# Patient Record
Sex: Female | Born: 1955 | Race: Black or African American | Hispanic: No | Marital: Married | Smoking: Never smoker
Health system: Southern US, Community
[De-identification: ages and names within clinical notes are randomized; demographics above are authoritative.]

## PROBLEM LIST (undated history)

## (undated) DIAGNOSIS — R51 Headache: Secondary | ICD-10-CM

## (undated) DIAGNOSIS — R519 Headache, unspecified: Secondary | ICD-10-CM

## (undated) DIAGNOSIS — E119 Type 2 diabetes mellitus without complications: Secondary | ICD-10-CM

## (undated) DIAGNOSIS — G4733 Obstructive sleep apnea (adult) (pediatric): Secondary | ICD-10-CM

## (undated) DIAGNOSIS — I10 Essential (primary) hypertension: Secondary | ICD-10-CM

## (undated) DIAGNOSIS — E782 Mixed hyperlipidemia: Secondary | ICD-10-CM

## (undated) DIAGNOSIS — Z9989 Dependence on other enabling machines and devices: Secondary | ICD-10-CM

## (undated) HISTORY — DX: Obstructive sleep apnea (adult) (pediatric): G47.33

## (undated) HISTORY — DX: Headache, unspecified: R51.9

## (undated) HISTORY — PX: ANKLE FRACTURE SURGERY: SHX122

## (undated) HISTORY — DX: Mixed hyperlipidemia: E78.2

## (undated) HISTORY — DX: Dependence on other enabling machines and devices: Z99.89

## (undated) HISTORY — PX: OTHER SURGICAL HISTORY: SHX169

## (undated) HISTORY — PX: FOOT TENDON SURGERY: SHX958

## (undated) HISTORY — DX: Type 2 diabetes mellitus without complications: E11.9

## (undated) HISTORY — PX: ABDOMINAL HYSTERECTOMY: SHX81

## (undated) HISTORY — DX: Headache: R51

## (undated) HISTORY — PX: BREAST BIOPSY: SHX20

---

## 1998-11-11 ENCOUNTER — Encounter: Payer: Self-pay | Admitting: Emergency Medicine

## 1998-11-11 ENCOUNTER — Emergency Department (HOSPITAL_COMMUNITY): Admission: EM | Admit: 1998-11-11 | Discharge: 1998-11-11 | Payer: Self-pay | Admitting: Emergency Medicine

## 2000-05-15 ENCOUNTER — Other Ambulatory Visit: Admission: RE | Admit: 2000-05-15 | Discharge: 2000-05-15 | Payer: Self-pay | Admitting: Obstetrics and Gynecology

## 2000-07-18 ENCOUNTER — Inpatient Hospital Stay (HOSPITAL_COMMUNITY): Admission: EM | Admit: 2000-07-18 | Discharge: 2000-07-19 | Payer: Self-pay

## 2000-07-18 ENCOUNTER — Encounter: Payer: Self-pay | Admitting: Cardiology

## 2001-08-05 ENCOUNTER — Other Ambulatory Visit: Admission: RE | Admit: 2001-08-05 | Discharge: 2001-08-05 | Payer: Self-pay | Admitting: Obstetrics and Gynecology

## 2002-09-16 ENCOUNTER — Other Ambulatory Visit: Admission: RE | Admit: 2002-09-16 | Discharge: 2002-09-16 | Payer: Self-pay | Admitting: Obstetrics and Gynecology

## 2004-08-04 ENCOUNTER — Encounter: Admission: RE | Admit: 2004-08-04 | Discharge: 2004-08-04 | Payer: Self-pay | Admitting: Obstetrics and Gynecology

## 2005-07-11 ENCOUNTER — Encounter: Admission: RE | Admit: 2005-07-11 | Discharge: 2005-07-11 | Payer: Self-pay | Admitting: Rheumatology

## 2005-08-15 ENCOUNTER — Encounter: Admission: RE | Admit: 2005-08-15 | Discharge: 2005-08-15 | Payer: Self-pay | Admitting: Obstetrics and Gynecology

## 2006-07-16 ENCOUNTER — Inpatient Hospital Stay (HOSPITAL_COMMUNITY): Admission: EM | Admit: 2006-07-16 | Discharge: 2006-07-18 | Payer: Self-pay | Admitting: Emergency Medicine

## 2006-07-17 ENCOUNTER — Encounter (INDEPENDENT_AMBULATORY_CARE_PROVIDER_SITE_OTHER): Payer: Self-pay | Admitting: *Deleted

## 2006-10-24 ENCOUNTER — Encounter: Admission: RE | Admit: 2006-10-24 | Discharge: 2006-10-24 | Payer: Self-pay | Admitting: Family Medicine

## 2007-12-09 ENCOUNTER — Encounter: Admission: RE | Admit: 2007-12-09 | Discharge: 2007-12-09 | Payer: Self-pay | Admitting: Family Medicine

## 2009-03-15 ENCOUNTER — Encounter: Admission: RE | Admit: 2009-03-15 | Discharge: 2009-03-15 | Payer: Self-pay | Admitting: Family Medicine

## 2009-06-09 ENCOUNTER — Inpatient Hospital Stay (HOSPITAL_COMMUNITY): Admission: EM | Admit: 2009-06-09 | Discharge: 2009-06-11 | Payer: Self-pay | Admitting: Interventional Cardiology

## 2009-06-09 ENCOUNTER — Ambulatory Visit: Payer: Self-pay | Admitting: Internal Medicine

## 2009-06-09 ENCOUNTER — Ambulatory Visit: Payer: Self-pay | Admitting: Diagnostic Radiology

## 2009-07-13 ENCOUNTER — Encounter: Admission: RE | Admit: 2009-07-13 | Discharge: 2009-07-13 | Payer: Self-pay | Admitting: Family Medicine

## 2010-02-15 ENCOUNTER — Encounter: Admission: RE | Admit: 2010-02-15 | Discharge: 2010-02-15 | Payer: Self-pay | Admitting: Obstetrics and Gynecology

## 2010-05-28 ENCOUNTER — Encounter: Payer: Self-pay | Admitting: Rheumatology

## 2010-05-28 ENCOUNTER — Encounter: Payer: Self-pay | Admitting: Family Medicine

## 2010-07-03 ENCOUNTER — Other Ambulatory Visit: Payer: Self-pay | Admitting: Family Medicine

## 2010-07-03 DIAGNOSIS — R51 Headache: Secondary | ICD-10-CM

## 2010-07-04 ENCOUNTER — Ambulatory Visit
Admission: RE | Admit: 2010-07-04 | Discharge: 2010-07-04 | Disposition: A | Discharge status: Home / Self Care | Payer: Managed Care, Other (non HMO) | Source: Ambulatory Visit | Attending: Family Medicine | Admitting: Family Medicine

## 2010-07-04 DIAGNOSIS — R51 Headache: Secondary | ICD-10-CM

## 2010-07-05 ENCOUNTER — Other Ambulatory Visit (HOSPITAL_BASED_OUTPATIENT_CLINIC_OR_DEPARTMENT_OTHER): Payer: Self-pay | Admitting: Family Medicine

## 2010-07-05 ENCOUNTER — Ambulatory Visit (HOSPITAL_BASED_OUTPATIENT_CLINIC_OR_DEPARTMENT_OTHER)
Admission: RE | Admit: 2010-07-05 | Discharge: 2010-07-05 | Disposition: A | Discharge status: Home / Self Care | Payer: Managed Care, Other (non HMO) | Source: Ambulatory Visit | Attending: Family Medicine | Admitting: Family Medicine

## 2010-07-05 DIAGNOSIS — R51 Headache: Secondary | ICD-10-CM

## 2010-07-05 DIAGNOSIS — M949 Disorder of cartilage, unspecified: Secondary | ICD-10-CM | POA: Insufficient documentation

## 2010-07-05 DIAGNOSIS — M899 Disorder of bone, unspecified: Secondary | ICD-10-CM | POA: Insufficient documentation

## 2010-07-26 LAB — COMPREHENSIVE METABOLIC PANEL
ALT: 15 U/L (ref 0–35)
AST: 16 U/L (ref 0–37)
CO2: 26 mEq/L (ref 19–32)
Calcium: 9 mg/dL (ref 8.4–10.5)
Creatinine, Ser: 0.92 mg/dL (ref 0.4–1.2)
GFR calc Af Amer: >60 mL/min (ref >60)
GFR calc non Af Amer: >60 mL/min (ref >60)
Sodium: 141 mEq/L (ref 135–145)
Total Protein: 6.6 g/dL (ref 6.0–8.3)

## 2010-07-26 LAB — CBC
HCT: 34.2 % — ABNORMAL LOW (ref 36.0–46.0)
MCHC: 33.4 g/dL (ref 30.0–36.0)
MCHC: 33.6 g/dL (ref 30.0–36.0)
MCV: 82.2 fL (ref 78.0–100.0)
MCV: 83.6 fL (ref 78.0–100.0)
Platelets: 284 10*3/uL (ref 150–400)
RBC: 4.17 MIL/uL (ref 3.87–5.11)
RDW: 16.2 % — ABNORMAL HIGH (ref 11.5–15.5)
WBC: 7.6 10*3/uL (ref 4.0–10.5)

## 2010-07-26 LAB — CARDIAC PANEL(CRET KIN+CKTOT+MB+TROPI)
Relative Index: INVALID (ref 0.0–2.5)
Relative Index: INVALID (ref 0.0–2.5)
Total CK: 51 U/L (ref 7–177)
Total CK: 53 U/L (ref 7–177)
Troponin I: 0.02 ng/mL (ref 0.00–0.06)

## 2010-07-26 LAB — GLUCOSE, CAPILLARY: Glucose-Capillary: 104 mg/dL — ABNORMAL HIGH (ref 70–99)

## 2010-07-26 LAB — HEMOGLOBIN A1C: Hgb A1c MFr Bld: 6.2 % — ABNORMAL HIGH (ref 4.6–6.1)

## 2010-07-26 LAB — MRSA PCR SCREENING: MRSA by PCR: NEGATIVE

## 2010-07-26 LAB — MAGNESIUM: Magnesium: 2 mg/dL (ref 1.5–2.5)

## 2010-07-26 LAB — HEPARIN LEVEL (UNFRACTIONATED): Heparin Unfractionated: 0.41 IU/mL (ref 0.30–0.70)

## 2010-07-27 LAB — CBC
MCHC: 33.4 g/dL (ref 30.0–36.0)
MCV: 81 fL (ref 78.0–100.0)
Platelets: 361 10*3/uL (ref 150–400)
RDW: 15 % (ref 11.5–15.5)

## 2010-07-27 LAB — BASIC METABOLIC PANEL
BUN: 17 mg/dL (ref 6–23)
CO2: 31 mEq/L (ref 19–32)
Calcium: 10.4 mg/dL (ref 8.4–10.5)
Chloride: 101 mEq/L (ref 96–112)
Creatinine, Ser: 0.9 mg/dL (ref 0.4–1.2)

## 2010-07-27 LAB — POCT CARDIAC MARKERS: Troponin i, poc: <0.05 ng/mL (ref 0.00–0.09)

## 2010-07-27 LAB — URINALYSIS, ROUTINE W REFLEX MICROSCOPIC
Bilirubin Urine: NEGATIVE
Glucose, UA: NEGATIVE mg/dL
Hgb urine dipstick: NEGATIVE
Ketones, ur: NEGATIVE mg/dL
Protein, ur: NEGATIVE mg/dL

## 2010-07-27 LAB — PROTIME-INR
INR: 0.96 (ref 0.00–1.49)
Prothrombin Time: 12.7 seconds (ref 11.6–15.2)

## 2010-09-22 NOTE — H&P (Signed)
[name] NO.:  [id number]   MEDICAL RECORD NO.:  [id number]          PATIENT TYPE:  INP   LOCATION:  6527                         FACILITY:  [facility]   PHYSICIAN:  Elmore Guise., M.D.DATE OF BIRTH:  10-09-1955   DATE OF ADMISSION:  07/16/2006  DATE OF DISCHARGE:                              HISTORY & PHYSICAL   PRIMARY CARE PHYSICIAN:  Dr. [facility].   HISTORY OF PRESENT ILLNESS:  The patient is a very pleasant 55 year old  African American female with past medical history of gastroesophageal  reflux disease, hypertension and family history of heart disease who  presents for evaluation of chest pain and shortness of breath.  The  patient reports normal state of health, however, notes increased stress  at work and at home.  She does state her mother died from heart attack  approximately 4 months ago and since that time, she has been having off-  and-on chest pain.  She describes her initial symptoms as occasional  left-sided chest pain over the last couple weeks that feels like a  twinge.  She will take an aspirin with significant improvement.  Today  she had left-sided chest pressure associated with mild dyspnea and  diaphoresis.  She was given a nitroglycerin with improvement.  She  denies any orthopnea, no PND.  She does report epigastric discomfort as  well as hot flashes.  She has been able to do her normal exercise  without any significant problems.  Her symptoms did worsen while walking  to the car this afternoon.   CURRENT MEDICATIONS:  Ziac and Norvasc.   ALLERGIES:  STADOL causing shortness of breath.   FAMILY HISTORY:  Positive for heart disease.   SOCIAL HISTORY:  She is married.  She works at a local back in [employer].  No tobacco.  Does have occasional alcohol use.   PHYSICAL EXAMINATION:  VITAL SIGNS:  She is afebrile.  Blood pressure  130-160/60-80, heart rate is 70-80.  She is sating 97% on  room air.  GENERAL APPEARANCE:  She is a very pleasant middle-aged [place] American  female, alert and oriented x4.  No acute distress.  NECK:  She has no JVD and no bruits.  LUNGS:  Clear.  HEART:  Regular with no significant murmurs, rubs, or gallops.  ABDOMEN:  Mild epigastric tenderness but no rebound or guarding.  She  has normal bowel sounds and no bruits noted.  EXTREMITIES:  Warm with 2+ pulses and no significant edema.   LABORATORY DATA:  A white count of 10.6, hemoglobin 12.3, platelet count  of 333.  Her coags are normal.  BUN and creatinine are 19 and 1.1,  potassium 3.6, myoglobin is 78-91.  Troponin I is less than 0.05 x2 and  myoglobin is less than 1 and 1.3.   Chest x-ray showed no acute cardiopulmonary disease.   ECG shows normal sinus rhythm, right atrial enlargement and LVH with  nonspecific ST-T wave changes noted in the inferolateral lead.  Old  chart is still pending at time of dictation.  No comparisons are  available currently.   IMPRESSION:  1. Chest pain.  2. Hypertension.  3. Increased stress.  4. History of gastroesophageal reflux disease.   PLAN:  The patient will be admitted for observation.  We will continue  serial cardiac enzymes.  She will be given Lovenox 1 mg/kg subcu x1 dose  tonight.  We will continue Ziac and Norvasc.  Will add Ranexa and give a  GI cocktail.  Should her symptoms resolve and she is up and ambulatory  with negative cardiac markers, outpatient workup would be indicated.  However, if her cardiac markers are positive or if she has further  symptoms, then inpatient workup would be indicated.  I discussed this at  length with her and her husband.  They did report that her last cardiac  catheterization was done back in 2000, from the computer shows that she  was cathed in 2002, showing normal coronaries and normal LV systolic  function.  All her questions were answered.      Elmore Guise., M.D.  Electronically  Signed     TWK/MEDQ  D:  07/16/2006  T:  07/18/2006  Job:  [id number]

## 2010-09-22 NOTE — Discharge Summary (Signed)
[place]. [facility]  Patient:    [name], [name]                       MRN: [id number] Adm. Date:  07/18/00 Disc. Date: 07/19/00 Attending:  Francisca December, M.D. CC:         Marinda Elk, M.D.   Discharge Summary  REASON FOR ADMISSION:  Chest pain.  HISTORY OF PRESENT ILLNESS:  The patient is a 55 year old woman with a long history of GERD who developed spontaneous onset of left arm aching pain around 1430 today.  She took a BC powder, felt a little better for awhile, then worsened.  Had some mild diaphoresis and and episode of lightheadedness.  She reported to her physicians office and while awaiting to be seen, she had the onset of substernal dull aching pain.  This was associated with dyspnea, no diaphoresis but there was mild nausea.  An ECG obtained there was thought to be abnormal and EMS was called.  She was transported to [facility].  En route, she received sublingual nitroglycerin x 3.  Upon my evaluation in the emergency room, she appeared reasonably comfortable, complaining of heavy substernal chest pain, which was "worse".  She had slight associated arm pain.  There was a pleuritic component to her pain.  The patient has a long history of GERD symptoms and was treated for 3-4 months with Prevacid up to 3-4 months ago.  She has frequent problems with reflux at nights, characterized by epigastric discomfort, burning sensation below the sternum and pyrosis.  It is frequently associated with a sensation of closing airway and she has to have her husband perform a Heimlich maneuver (!?).  No food is usually ejected, but she is able to breathe better.  Again, she has not had any pro-time pump inhibitor for the past 3-4 months.  PAST MEDICAL HISTORY: 1. GERD as above. 2. TAH and LSO, approximately five years ago, recently started on Premarin. Otherwise, her past medical history is unremarkable.  There is no history of diabetes, hypertension  or hyperlipidemia.  CURRENT MEDICATIONS:  Premarin 0.625 p.o. q.d.  ALLERGIES:  STADOL; caused difficulty breathing.  FAMILY HISTORY:  Mother has sarcoid cardiomyopathy and a pacemaker.  There is no history of early coronary disease except a grandmother who died suddenly at the age of 58.  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  She is accompanied in the emergency room by her husband this evening.  She has three children, ages 24 to 41.  She is a nonsmoker and nondrinker.  She does not use any recreational drugs.  REVIEW OF SYSTEMS:  She wears contacts, has good hearing, teeth and gums are in good repair, no frequent headache.  She has dysphagia frequently to solid foods, not to liquid.  It is usually up in the throat.  She denies any problems with cough, wheezing or hemoptysis, no chronic abdominal pain, no orthopnea, PND, except associated with GERD.  No lower extremity edema.  She has not had any hematemesis or hematochezia, no melena, no hematuria, nocturia, dysuria, no difficulty starting or stopping her stream.  She has soreness in both knees, no muscle weakness or muscle pain.  She has never had a stroke, no paresthesias, dysesthesias, lightheadedness, vertigo and she has never been treated for an emotional disorder.  PHYSICAL EXAMINATION:  VITAL SIGNS:  Blood pressure is 160/90, pulse is 100 and regular, respiratory rate 22, temperature 99.3.  O2 saturation 98% on room air.  GENERAL:  She is an anxious, but well-appearing 55 year old woman in no acute distress.  HEENT:  Uremarkable.  Head is normocephalic and atraumatic.  Pupils are equal, round and reactive to light and accommodation.  Sclera are anicteric.  Oral mucosa is pink and moist.  Teeth and gums are in good repair.  Neck is supple without thyromegaly or masses.  ______ normal, no bruit, no jugular venous distension.  CHEST:  Her chest is clear with good excursion  bilaterally, normal vesicular, breath sounds are heard throughout the precordium, is quiet.  Chest wall is palpated and is tender along the left costochondral margin, but not on the right, reproduces a rather sharp aching pain.  CARDIOVASCULAR:  Normal S1, S2, no S3, S3, murmur, click or rub noted.  PMI is not palpable.  ABDOMEN:  The abdomen is mildly obese, soft, nontender, no hepatosplenomegaly, no masses, no midline pulsatile mass, bowel sounds present in all quadrants.  GENITALIA:  External genitalia appears normal.  Rectal not performed.  EXTREMITIES:  Extremities show full range of motion, no edema ______ .  NEUROLOGICAL:  Cranial nerves II-XII intact.  Motor and sensory is grossly intact.  Gait not tested.  SKIN:  Warm, dry and clear.  ELECTROCARDIOGRAM:  Tracing performed at [facility] showed diffuse mild ST segment depression, especially in the inferior and lateral V leads.  EKG performed here again continued to have chest heaviness, appears more normal with general resolution of the ST abnormality.  IMPRESSION:  Atypical angina, spontaneous, possible UAP (doubt), more likely gastroesophageal reflux disease with esophageal spasm.  She also has a component of chest wall pain.  PLAN:  She will be admitted to rule out a myocardial infarction, serial CK-MB and troponin enzymes will be obtained.  Repeat ECG.  Will treat initially with IV nitroglycerin and IV heparin, pharmacy to dose, provide a GI cocktail and IV Protonix as well as oral beta blocker.  If a myocardial infarction is ruled out, then exercise cardiolite will be undertaken tomorrow. DD:  07/18/00 TD:  07/18/00 Job: [id number]

## 2010-09-22 NOTE — Discharge Summary (Signed)
NAMECHAKITA, [name]              ACCOUNT NO.:  [id number]   MEDICAL RECORD NO.:  [id number]          PATIENT TYPE:  INP   LOCATION:  6527                         FACILITY:  [facility]   PHYSICIAN:  Elmore Guise., M.D.DATE OF BIRTH:  [birth year]   DATE OF ADMISSION:  07/16/2006  DATE OF DISCHARGE:  07/18/2006                               DISCHARGE SUMMARY   DISCHARGE DIAGNOSIS:  1. Chest pain.  2. History of hypertension.  3. Hypertriglyceridemia.  4. Strong family history of heart disease.   HISTORY OF PRESENT ILLNESS:  Mr. [name] is a very pleasant 55 year old  African-American female who was admitted with exertional chest  pain/pressure and shortness of breath.   HOSPITAL COURSE:  The patient's hospital course was uncomplicated.  She  was admitted to telemetry monitoring.  She had serial cardiac enzymes  performed which were negative for ischemia.  She did have serial ECGs  which showed nonspecific inferolateral ST-T wave changes, more prominent  from her prior ECGs.  She had an echocardiogram performed which showed  normal LV size and function with an EF of 55-60% and no wall motion  abnormalities.  She was placed on Ranexa, and she was up and ambulatory  with continued exertional chest heaviness.  She then was referred for a  cardiac catheterization.  Her cardiac catheterization was performed on  March 13 and showed normal-appearing coronary arteries with normal LV  systolic function.  She tolerated her procedure well and has been up and  active without any significant problems.   MEDICATIONS:  She will be discharged home to continue the following  medications:  1. Hormone replacement therapy.  2. Menest 0.625 mg daily.  3. Ziac once daily.  4. Ranexa 500 mg daily (Samples given).  5. Aspirin 81 mg daily.  6. Pepcid complete one to two daily.  7. Fish oil tablets 1000 mg 2 tablets twice a day.   She is to limit her activities for the next 48 hours with no heavy  lifting and no strenuous activities during that time period.  She is not  to go back to her normal work activities until Monday March17.  She  is to call the office with any problems or concerns.  Otherwise, we plan  to see her back in 2 weeks.  Her only lab work abnormality showed an  elevated triglycerides in the 250 range.  All of her other lipids were  in oral within normal parameters.  Please see complete summary for full  details.     Elmore Guise., M.D.  Electronically Signed    TWK/MEDQ  D:  07/19/2006  T:  07/20/2006  Job:  [id number]

## 2010-09-22 NOTE — Cardiovascular Report (Signed)
[place]. [facility]  Patient:    [name], [name]                       MRN: [id number] Proc. Date: 07/19/00 Attending:  Francisca December, M.D. CC:         Gladstone Pih, M.D.  Cardiac Catheterization Laboratory   Cardiac Catheterization  PROCEDURES PERFORMED: 1. Left heart catheterization. 2. Coronary angiography. 3. Left ventriculogram.  INDICATIONS:  [name] is a 55 year old woman, who was admitted yesterday evening after a prolonged episode of chest pain which is anginal in nature.  She has had chest pain on and off throughout the evening, responds to both IV and sublingual nitroglycerin as well as Demerol.  Electrocardiogram has been nonspecific.  Total CK-MB and troponin have been normal.  Because of persistent chest pain, she is brought to the catheterization laboratory at this time for identification of possible coronary artery disease and provide for further therapeutic options.  DESCRIPTION OF PROCEDURE:  Left heart catheterization was performed following the percutaneous insertion of a 6 French catheter sheath utilizing an anterior approach over a guiding J wire into the right femoral artery.  A 110 cm pigtail catheter was used to measure pressures in the ascending aorta and left ventricle, both prior to and following the ventriculogram.  A 30 degree RAO cine left ventriculogram was performed utilizing a power injector.  A 6 French #4 left and right Judkins catheters were used to perform coronary angiography. Cine angiography of each coronary artery was conducted in multiple LAO and RAO projections.  At the completion of the procedure, the catheter and catheter sheaths were removed.  Hemostasis was achieved by direct pressure.  The patient was transported to the recovery area in stable condition with intact distal pulse.  HEMODYNAMICS:  Systemic arterial pressure was 139/83 with a mean of 109 mmHg. There was no systolic gradient across  the aortic valve.  The left ventricular end-diastolic pressure was 18 mmHg preventriculogram and 20 mmHg post ventriculogram.  ANGIOGRAPHY:  LEFT VENTRICULOGRAM:  The left ventriculogram demonstrated normal global systolic function and normal regional wall motion.  The calculated ejection fraction utilizing the single plane cine method was 58%.  There was no mitral regurgitation and there was no coronary calcification seen.  There was a right dominant coronary system present.  The main left coronary artery normal.  The left anterior descending artery and its branches were normal.  The left circumflex artery and its branches were normal.  The right coronary artery and its branches were normal.  Collateral vessels were not seen.  FINAL IMPRESSION: 1. Intact left ventricular size and global systolic function. 2. Normal coronary arteries. 3. Noncardiac chest pain.  PLAN/RECOMENDATIONS:  The patient is presented with this gratifying news.  She will be treated with proton pump inhibitor, Nexium 40 mg p.o. q.d. and told to expect to not have any significant improvement for 7-10 days.  She should probably stay on this long-term given the degree of her GERD symptoms. DD:  07/19/00 TD:  07/19/00 Job: [id number]

## 2010-09-22 NOTE — Cardiovascular Report (Signed)
[name], [name]              ACCOUNT NO.:  [id number]   MEDICAL RECORD NO.:  [id number]          PATIENT TYPE:  INP   LOCATION:  6527                         FACILITY:  [facility]   PHYSICIAN:  Elmore Guise., M.D.DATE OF BIRTH:  05/25/1955   DATE OF PROCEDURE:  07/18/2006  DATE OF DISCHARGE:                            CARDIAC CATHETERIZATION   INDICATIONS FOR PROCEDURE:  Increased exertional dyspnea, chest pain,  abnormal ECG.   HISTORY OF PRESENT ILLNESS:  [name] is a very pleasant 55 year old  African American female with a past medical history of dyslipidemia and  hypertension, who presented with increasing exertional dyspnea and chest  pain.  She has a strong family history of heart disease with her mother  dying from a heart attack approximately 4 months ago.  She is now  referred for cardiac catheterization.   DESCRIPTION OF PROCEDURE:  The patient was brought to the cardiac cath  lab, after appropriate informed consent.  She was prepped and draped in  a sterile fashion.  Approximately 10 mL of 1% lidocaine was used for  local anesthesia.  A 5-French sheath was placed in the right femoral  artery without difficulty.  Coronary angiography, LV angiography were  then performed.  The patient was transferred from the cardiac cath lab  in stable condition.   FINDINGS:  1. Left Main:  Normal (long vessel).  2. LAD:  Normal-appearing with no significant obstructive disease      noted.  3. D-1:  Small vessel, normal-appearing.  4. LCX:  Normal-appearing.  5. OM-1:  Normal-appearing.  6. RCA:  Dominant, normal-appearing.  7. LV:  EF 60%, no wall motion abnormalities.  LVEDP is 14-mmHg.   IMPRESSION:  1. Normal-appearing coronary arteries.  2. Normal left ventricular systolic function, ejection fraction of      60%.   PLAN:  At this time, I would recommend aggressive risk factor  modification as indicated.      Elmore Guise., M.D.  Electronically  Signed     TWK/MEDQ  D:  07/18/2006  T:  07/19/2006  Job:  [id number]

## 2011-03-01 ENCOUNTER — Other Ambulatory Visit: Payer: Self-pay | Admitting: Family Medicine

## 2011-03-01 DIAGNOSIS — Z1231 Encounter for screening mammogram for malignant neoplasm of breast: Secondary | ICD-10-CM

## 2011-03-14 ENCOUNTER — Other Ambulatory Visit: Payer: Self-pay | Admitting: Family Medicine

## 2011-03-14 ENCOUNTER — Ambulatory Visit
Admission: RE | Admit: 2011-03-14 | Discharge: 2011-03-14 | Disposition: A | Discharge status: Home / Self Care | Payer: Managed Care, Other (non HMO) | Source: Ambulatory Visit | Attending: Family Medicine | Admitting: Family Medicine

## 2011-03-14 DIAGNOSIS — R928 Other abnormal and inconclusive findings on diagnostic imaging of breast: Secondary | ICD-10-CM

## 2011-03-14 DIAGNOSIS — Z1231 Encounter for screening mammogram for malignant neoplasm of breast: Secondary | ICD-10-CM

## 2011-03-20 ENCOUNTER — Ambulatory Visit: Payer: No Typology Code available for payment source

## 2011-04-02 ENCOUNTER — Other Ambulatory Visit: Payer: Self-pay | Admitting: Family Medicine

## 2011-04-02 ENCOUNTER — Ambulatory Visit
Admission: RE | Admit: 2011-04-02 | Discharge: 2011-04-02 | Disposition: A | Discharge status: Home / Self Care | Payer: No Typology Code available for payment source | Source: Ambulatory Visit | Attending: Family Medicine | Admitting: Family Medicine

## 2011-04-02 ENCOUNTER — Other Ambulatory Visit: Payer: Self-pay | Admitting: Diagnostic Radiology

## 2011-04-02 DIAGNOSIS — R928 Other abnormal and inconclusive findings on diagnostic imaging of breast: Secondary | ICD-10-CM

## 2012-06-03 ENCOUNTER — Other Ambulatory Visit: Payer: Self-pay | Admitting: Family Medicine

## 2012-06-03 DIAGNOSIS — Z1231 Encounter for screening mammogram for malignant neoplasm of breast: Secondary | ICD-10-CM

## 2012-06-12 ENCOUNTER — Ambulatory Visit
Admission: RE | Admit: 2012-06-12 | Discharge: 2012-06-12 | Disposition: A | Discharge status: Home / Self Care | Payer: Managed Care, Other (non HMO) | Source: Ambulatory Visit | Attending: Family Medicine | Admitting: Family Medicine

## 2012-06-12 DIAGNOSIS — Z1231 Encounter for screening mammogram for malignant neoplasm of breast: Secondary | ICD-10-CM

## 2013-04-06 ENCOUNTER — Other Ambulatory Visit: Payer: Self-pay

## 2013-04-06 DIAGNOSIS — Z1231 Encounter for screening mammogram for malignant neoplasm of breast: Secondary | ICD-10-CM

## 2013-06-15 ENCOUNTER — Ambulatory Visit
Admission: RE | Admit: 2013-06-15 | Discharge: 2013-06-15 | Disposition: A | Discharge status: Home / Self Care | Payer: Managed Care, Other (non HMO) | Source: Ambulatory Visit

## 2013-06-15 DIAGNOSIS — Z1231 Encounter for screening mammogram for malignant neoplasm of breast: Secondary | ICD-10-CM

## 2014-03-18 ENCOUNTER — Other Ambulatory Visit: Payer: Self-pay | Admitting: Orthopedic Surgery

## 2014-03-18 DIAGNOSIS — M25511 Pain in right shoulder: Secondary | ICD-10-CM

## 2014-03-27 ENCOUNTER — Ambulatory Visit
Admission: RE | Admit: 2014-03-27 | Discharge: 2014-03-27 | Disposition: A | Discharge status: Home / Self Care | Payer: Managed Care, Other (non HMO) | Source: Ambulatory Visit | Attending: Orthopedic Surgery | Admitting: Orthopedic Surgery

## 2014-03-27 DIAGNOSIS — M25511 Pain in right shoulder: Secondary | ICD-10-CM

## 2014-04-14 ENCOUNTER — Other Ambulatory Visit: Payer: Self-pay | Admitting: Family Medicine

## 2014-04-14 ENCOUNTER — Ambulatory Visit (INDEPENDENT_AMBULATORY_CARE_PROVIDER_SITE_OTHER): Payer: Managed Care, Other (non HMO)

## 2014-04-14 DIAGNOSIS — I10 Essential (primary) hypertension: Secondary | ICD-10-CM

## 2014-04-14 DIAGNOSIS — R51 Headache: Principal | ICD-10-CM

## 2014-04-14 DIAGNOSIS — R519 Headache, unspecified: Secondary | ICD-10-CM

## 2014-04-28 ENCOUNTER — Emergency Department (HOSPITAL_COMMUNITY)
Admission: EM | Admit: 2014-04-28 | Discharge: 2014-04-28 | Disposition: A | Discharge status: Home / Self Care | Payer: Managed Care, Other (non HMO) | Attending: Emergency Medicine | Admitting: Emergency Medicine

## 2014-04-28 ENCOUNTER — Emergency Department (HOSPITAL_COMMUNITY): Payer: Managed Care, Other (non HMO)

## 2014-04-28 ENCOUNTER — Encounter (HOSPITAL_COMMUNITY): Payer: Self-pay | Admitting: Emergency Medicine

## 2014-04-28 DIAGNOSIS — R51 Headache: Secondary | ICD-10-CM

## 2014-04-28 DIAGNOSIS — I10 Essential (primary) hypertension: Secondary | ICD-10-CM

## 2014-04-28 DIAGNOSIS — H538 Other visual disturbances: Secondary | ICD-10-CM | POA: Diagnosis not present

## 2014-04-28 DIAGNOSIS — R519 Headache, unspecified: Secondary | ICD-10-CM

## 2014-04-28 DIAGNOSIS — R079 Chest pain, unspecified: Secondary | ICD-10-CM | POA: Diagnosis not present

## 2014-04-28 HISTORY — DX: Essential (primary) hypertension: I10

## 2014-04-28 LAB — CBC WITH DIFFERENTIAL/PLATELET
BASOS ABS: 0 10*3/uL (ref 0.0–0.1)
BASOS PCT: 0 % (ref 0–1)
EOS ABS: 0.3 10*3/uL (ref 0.0–0.7)
Eosinophils Relative: 3 % (ref 0–5)
HEMATOCRIT: 38.4 % (ref 36.0–46.0)
HEMOGLOBIN: 13 g/dL (ref 12.0–15.0)
Lymphocytes Relative: 32 % (ref 12–46)
Lymphs Abs: 2.8 10*3/uL (ref 0.7–4.0)
MCH: 27.2 pg (ref 26.0–34.0)
MCHC: 33.9 g/dL (ref 30.0–36.0)
MCV: 80.3 fL (ref 78.0–100.0)
MONO ABS: 0.5 10*3/uL (ref 0.1–1.0)
MONOS PCT: 6 % (ref 3–12)
NEUTROS ABS: 5.3 10*3/uL (ref 1.7–7.7)
NEUTROS PCT: 59 % (ref 43–77)
Platelets: 383 10*3/uL (ref 150–400)
RBC: 4.78 MIL/uL (ref 3.87–5.11)
RDW: 14.1 % (ref 11.5–15.5)
WBC: 8.9 10*3/uL (ref 4.0–10.5)

## 2014-04-28 LAB — I-STAT TROPONIN, ED
TROPONIN I, POC: 0 ng/mL (ref 0.00–0.08)
Troponin i, poc: 0 ng/mL (ref 0.00–0.08)

## 2014-04-28 LAB — BASIC METABOLIC PANEL
ANION GAP: 10 (ref 5–15)
BUN: 10 mg/dL (ref 6–23)
CO2: 24 mmol/L (ref 19–32)
CREATININE: 0.81 mg/dL (ref 0.50–1.10)
Calcium: 9.7 mg/dL (ref 8.4–10.5)
Chloride: 109 mEq/L (ref 96–112)
GFR, EST NON AFRICAN AMERICAN: 80 mL/min — AB (ref >90)
Glucose, Bld: 110 mg/dL — ABNORMAL HIGH (ref 70–99)
Potassium: 4.2 mmol/L (ref 3.5–5.1)
Sodium: 143 mmol/L (ref 135–145)

## 2014-04-28 LAB — URINALYSIS, ROUTINE W REFLEX MICROSCOPIC
BILIRUBIN URINE: NEGATIVE
GLUCOSE, UA: NEGATIVE mg/dL
HGB URINE DIPSTICK: NEGATIVE
KETONES UR: NEGATIVE mg/dL
Leukocytes, UA: NEGATIVE
Nitrite: NEGATIVE
PH: 6.5 (ref 5.0–8.0)
PROTEIN: NEGATIVE mg/dL
Specific Gravity, Urine: 1.012 (ref 1.005–1.030)
Urobilinogen, UA: 0.2 mg/dL (ref 0.0–1.0)

## 2014-04-28 MED ORDER — MORPHINE SULFATE 4 MG/ML IJ SOLN
4.0000 mg | Freq: Once | INTRAMUSCULAR | Status: DC
  Filled 2014-04-28: qty 1

## 2014-04-28 MED ORDER — CLONIDINE HCL 0.2 MG PO TABS
0.2000 mg | ORAL_TABLET | Freq: Once | ORAL | Status: AC
  Administered 2014-04-28: 0.2 mg via ORAL
  Filled 2014-04-28: qty 1

## 2014-04-28 MED ORDER — KETOROLAC TROMETHAMINE 30 MG/ML IJ SOLN
30.0000 mg | Freq: Once | INTRAMUSCULAR | Status: AC
  Administered 2014-04-28: 30 mg via INTRAVENOUS
  Filled 2014-04-28: qty 1

## 2014-04-28 MED ORDER — OXYCODONE-ACETAMINOPHEN 5-325 MG PO TABS
2.0000 | ORAL_TABLET | Freq: Once | ORAL | Status: AC
  Administered 2014-04-28: 2 via ORAL
  Filled 2014-04-28: qty 2

## 2014-04-28 MED ORDER — DIPHENHYDRAMINE HCL 50 MG/ML IJ SOLN
25.0000 mg | Freq: Once | INTRAMUSCULAR | Status: AC
  Administered 2014-04-28: 25 mg via INTRAVENOUS
  Filled 2014-04-28: qty 1

## 2014-04-28 MED ORDER — HYDRALAZINE HCL 20 MG/ML IJ SOLN
10.0000 mg | Freq: Once | INTRAMUSCULAR | Status: AC
Start: 2014-04-28 — End: 2014-04-28
  Administered 2014-04-28: 10 mg via INTRAVENOUS
  Filled 2014-04-28: qty 1

## 2014-04-28 MED ORDER — CLONIDINE HCL 0.1 MG PO TABS: 0.1000 mg | ORAL_TABLET | Freq: Two times a day (BID) | ORAL | Status: AC

## 2014-04-28 MED ORDER — ONDANSETRON HCL 4 MG/2ML IJ SOLN
4.0000 mg | Freq: Once | INTRAMUSCULAR | Status: AC
  Administered 2014-04-28: 4 mg via INTRAVENOUS
  Filled 2014-04-28: qty 2

## 2014-04-28 MED ORDER — METOCLOPRAMIDE HCL 5 MG/ML IJ SOLN
10.0000 mg | Freq: Once | INTRAMUSCULAR | Status: AC
  Administered 2014-04-28: 10 mg via INTRAVENOUS
  Filled 2014-04-28: qty 2

## 2014-04-28 MED ORDER — HYDRALAZINE HCL 20 MG/ML IJ SOLN
10.0000 mg | Freq: Once | INTRAMUSCULAR | Status: AC
  Administered 2014-04-28: 10 mg via INTRAVENOUS
  Filled 2014-04-28: qty 1

## 2014-04-28 NOTE — ED Notes (Signed)
Pt reports BP running 240/115 with medications.  Pt reports meds were adjusted about a week ago with no improvement.  Pt scheduled for right shoulder decompression today, can't have the surgery d/t hypertension.  Pt reports headaches with "sparks and flecks" in her vision.  Pt reports new onset chest pain in upper left chest.  NAD noted at this time.

## 2014-04-28 NOTE — ED Provider Notes (Signed)
TIME SEEN: 9:15 AM  CHIEF COMPLAINT: Headache, chest pain, blurry vision, hypertension  HPI: Pt is a 58 y.o. F with history of hypertension on losartan and atenolol who presents to the emergency department with complaints of 3-4 weeks of elevated blood pressure, constant diffuse throbbing headache, blurry vision and left-sided chest pain. No shortness of breath, numbness, tingling or focal weakness. States her blood pressure is normally in the 120s/80s. States her doctor recently increased her losartan and atenolol. She is followed by Dr. Betty SwazilandJordan.  ROS: See HPI Constitutional: no fever  Eyes: no drainage  ENT: no runny nose   Cardiovascular:  chest pain  Resp: no SOB  GI: no vomiting GU: no dysuria Integumentary: no rash  Allergy: no hives  Musculoskeletal: no leg swelling  Neurological: no slurred speech ROS otherwise negative  PAST MEDICAL HISTORY/PAST SURGICAL HISTORY:  Past Medical History  Diagnosis Date  . Hypertension     MEDICATIONS:  Prior to Admission medications   Not on File    ALLERGIES:  Allergies  Allergen Reactions  . Stadol [Butorphanol] Anaphylaxis  . Vicodin [Hydrocodone-Acetaminophen] Itching    SOCIAL HISTORY:  History  Substance Use Topics  . Smoking status: Never Smoker   . Smokeless tobacco: Never Used  . Alcohol Use: No    FAMILY HISTORY: No family history on file.  EXAM: BP 227/109 mmHg  Pulse 76  Temp(Src) 98.5 F (36.9 C) (Oral)  Resp 11  Ht 5\' 1"  (1.549 m)  Wt 190 lb (86.183 kg)  BMI 35.92 kg/m2  SpO2 99% CONSTITUTIONAL: Alert and oriented and responds appropriately to questions. Well-appearing; well-nourished HEAD: Normocephalic EYES: Conjunctivae clear, PERRL ENT: normal nose; no rhinorrhea; moist mucous membranes; pharynx without lesions noted NECK: Supple, no meningismus, no LAD  CARD: RRR; S1 and S2 appreciated; no murmurs, no clicks, no rubs, no gallops RESP: Normal chest excursion without splinting or tachypnea;  breath sounds clear and equal bilaterally; no wheezes, no rhonchi, no rales,  ABD/GI: Normal bowel sounds; non-distended; soft, non-tender, no rebound, no guarding BACK:  The back appears normal and is non-tender to palpation, there is no CVA tenderness EXT: Normal ROM in all joints; non-tender to palpation; no edema; normal capillary refill; no cyanosis    SKIN: Normal color for age and race; warm NEURO: Moves all extremities equally, sensation to light touch intact diffusely, cranial nerves II through XII intact gait PSYCH: The patient's mood and manner are appropriate. Grooming and personal hygiene are appropriate.  MEDICAL DECISION MAKING: Patient here with symptomatic hypertension. Will obtain labs, urine to evaluate for possible signs of end organ damage. EKG shows no new ischemic changes. We'll give hydralazine and reassess. We'll give Percocet for pain.  ED PROGRESS: Patient's labs are unremarkable including negative troponin. No hematuria or proteinuria. Normal creatinine. Blood pressure has not improved at all with IV hydralazine. She was given 1 dose of clonidine 0.2 mg with good improvement of her blood pressure and her other symptoms. She still has a mild headache. Head CT shows no acute abnormality. Chest x-ray clear. Will give dose of Toradol, Reglan and Benadryl and reassess.  1:50 PM  D/w Dr. SwazilandJordan who agrees with clonidine 0.1 mg twice a day. She states that the patient had a similar reaction and did very well with clonidine in the office. Patient reports she is feeling much better without headache, vision changes or chest pain. Second troponin negative. Dr. SwazilandJordan will schedule close follow-up. Discussed return precautions.   EKG Interpretation  Date/Time:  Wednesday April 28 2014 08:46:57 EST Ventricular Rate:  73 PR Interval:  122 QRS Duration: 83 QT Interval:  395 QTC Calculation: 435 R Axis:   15 Text Interpretation:  Sinus rhythm LVH with secondary repolarization  abnormality Baseline wander in lead(s) II III aVF V6 No significant change since last tracing Confirmed by Anjanae Woehrle,  DO, Brya Simerly [phone]) on 04/28/2014 9:13:58 AM         Layla MawKristen N Kohler Pellerito, DO 04/28/14 1636

## 2014-04-28 NOTE — ED Notes (Addendum)
Patient transported to CT and then going to xray

## 2014-04-28 NOTE — ED Notes (Signed)
Pt returned from ct/xr

## 2014-04-28 NOTE — Discharge Instructions (Signed)
Chest Pain (Nonspecific) °It is often hard to give a specific diagnosis for the cause of chest pain. There is always a chance that your pain could be related to something serious, such as a heart attack or a blood clot in the lungs. You need to follow up with your health care provider for further evaluation. °CAUSES  °· Heartburn. °· Pneumonia or bronchitis. °· Anxiety or stress. °· Inflammation around your heart (pericarditis) or lung (pleuritis or pleurisy). °· A blood clot in the lung. °· A collapsed lung (pneumothorax). It can develop suddenly on its own (spontaneous pneumothorax) or from trauma to the chest. °· Shingles infection (herpes zoster virus). °The chest wall is composed of bones, muscles, and cartilage. Any of these can be the source of the pain. °· The bones can be bruised by injury. °· The muscles or cartilage can be strained by coughing or overwork. °· The cartilage can be affected by inflammation and become sore (costochondritis). °DIAGNOSIS  °Lab tests or other studies may be needed to find the cause of your pain. Your health care provider may have you take a test called an ambulatory electrocardiogram (ECG). An ECG records your heartbeat patterns over a 24-hour period. You may also have other tests, such as: °· Transthoracic echocardiogram (TTE). During echocardiography, sound waves are used to evaluate how blood flows through your heart. °· Transesophageal echocardiogram (TEE). °· Cardiac monitoring. This allows your health care provider to monitor your heart rate and rhythm in real time. °· Holter monitor. This is a portable device that records your heartbeat and can help diagnose heart arrhythmias. It allows your health care provider to track your heart activity for several days, if needed. °· Stress tests by exercise or by giving medicine that makes the heart beat faster. °TREATMENT  °· Treatment depends on what may be causing your chest pain. Treatment may include: °· Acid blockers for  heartburn. °· Anti-inflammatory medicine. °· Pain medicine for inflammatory conditions. °· Antibiotics if an infection is present. °· You may be advised to change lifestyle habits. This includes stopping smoking and avoiding alcohol, caffeine, and chocolate. °· You may be advised to keep your head raised (elevated) when sleeping. This reduces the chance of acid going backward from your stomach into your esophagus. °Most of the time, nonspecific chest pain will improve within 2-3 days with rest and mild pain medicine.  °HOME CARE INSTRUCTIONS  °· If antibiotics were prescribed, take them as directed. Finish them even if you start to feel better. °· For the next few days, avoid physical activities that bring on chest pain. Continue physical activities as directed. °· Do not use any tobacco products, including cigarettes, chewing tobacco, or electronic cigarettes. °· Avoid drinking alcohol. °· Only take medicine as directed by your health care provider. °· Follow your health care provider's suggestions for further testing if your chest pain does not go away. °· Keep any follow-up appointments you made. If you do not go to an appointment, you could develop lasting (chronic) problems with pain. If there is any problem keeping an appointment, call to reschedule. °SEEK MEDICAL CARE IF:  °· Your chest pain does not go away, even after treatment. °· You have a rash with blisters on your chest. °· You have a fever. °SEEK IMMEDIATE MEDICAL CARE IF:  °· You have increased chest pain or pain that spreads to your arm, neck, jaw, back, or abdomen. °· You have shortness of breath. °· You have an increasing cough, or you cough   up blood. °· You have severe back or abdominal pain. °· You feel nauseous or vomit. °· You have severe weakness. °· You faint. °· You have chills. °This is an emergency. Do not wait to see if the pain will go away. Get medical help at once. Call your local emergency services (911 in [place] Do not drive  yourself to the hospital. °MAKE SURE YOU:  °· Understand these instructions. °· Will watch your condition. °· Will get help right away if you are not doing well or get worse. °Document Released: 01/31/2005 Document Revised: 04/28/2013 Document Reviewed: 11/27/2007 °ExitCare® Patient Information ©2015 [employer]. This information is not intended to replace advice given to you by your health care provider. Make sure you discuss any questions you have with your health care provider. ° °Hypertension °Hypertension, commonly called high blood pressure, is when the force of blood pumping through your arteries is too strong. Your arteries are the blood vessels that carry blood from your heart throughout your body. A blood pressure reading consists of a higher number over a lower number, such as 110/72. The higher number (systolic) is the pressure inside your arteries when your heart pumps. The lower number (diastolic) is the pressure inside your arteries when your heart relaxes. Ideally you want your blood pressure below 120/80. °Hypertension forces your heart to work harder to pump blood. Your arteries may become narrow or stiff. Having hypertension puts you at risk for heart disease, stroke, and other problems.  °RISK FACTORS °Some risk factors for high blood pressure are controllable. Others are not.  °Risk factors you cannot control include:  °· Race. You may be at higher risk if you are African American. °· Age. Risk increases with age. °· Gender. Men are at higher risk than women before age 45 years. After age 65, women are at higher risk than men. °Risk factors you can control include: °· Not getting enough exercise or physical activity. °· Being overweight. °· Getting too much fat, sugar, calories, or salt in your diet. °· Drinking too much alcohol. °SIGNS AND SYMPTOMS °Hypertension does not usually cause signs or symptoms. Extremely high blood pressure (hypertensive crisis) may cause headache, anxiety, shortness  of breath, and nosebleed. °DIAGNOSIS  °To check if you have hypertension, your health care provider will measure your blood pressure while you are seated, with your arm held at the level of your heart. It should be measured at least twice using the same arm. Certain conditions can cause a difference in blood pressure between your right and left arms. A blood pressure reading that is higher than normal on one occasion does not mean that you need treatment. If one blood pressure reading is high, ask your health care provider about having it checked again. °TREATMENT  °Treating high blood pressure includes making lifestyle changes and possibly taking medicine. Living a healthy lifestyle can help lower high blood pressure. You may need to change some of your habits. °Lifestyle changes may include: °· Following the DASH diet. This diet is high in fruits, vegetables, and whole grains. It is low in salt, red meat, and added sugars. °· Getting at least 2½ hours of brisk physical activity every week. °· Losing weight if necessary. °· Not smoking. °· Limiting alcoholic beverages. °· Learning ways to reduce stress. ° If lifestyle changes are not enough to get your blood pressure under control, your health care provider may prescribe medicine. You may need to take more than one. Work closely with your health care provider   to understand the risks and benefits. °HOME CARE INSTRUCTIONS °· Have your blood pressure rechecked as directed by your health care provider.   °· Take medicines only as directed by your health care provider. Follow the directions carefully. Blood pressure medicines must be taken as prescribed. The medicine does not work as well when you skip doses. Skipping doses also puts you at risk for problems.   °· Do not smoke.   °· Monitor your blood pressure at home as directed by your health care provider.  °SEEK MEDICAL CARE IF:  °· You think you are having a reaction to medicines taken. °· You have recurrent  headaches or feel dizzy. °· You have swelling in your ankles. °· You have trouble with your vision. °SEEK IMMEDIATE MEDICAL CARE IF: °· You develop a severe headache or confusion. °· You have unusual weakness, numbness, or feel faint. °· You have severe chest or abdominal pain. °· You vomit repeatedly. °· You have trouble breathing. °MAKE SURE YOU:  °· Understand these instructions. °· Will watch your condition. °· Will get help right away if you are not doing well or get worse. °Document Released: 04/23/2005 Document Revised: 09/07/2013 Document Reviewed: 02/13/2013 °ExitCare® Patient Information ©2015 [employer]. This information is not intended to replace advice given to you by your health care provider. Make sure you discuss any questions you have with your health care provider. ° °

## 2014-04-28 NOTE — ED Notes (Signed)
MD at bedside. 

## 2014-04-28 NOTE — ED Notes (Signed)
Pt made aware to return if symptoms worsen or if any life threatening symptoms occur.   

## 2014-07-30 ENCOUNTER — Other Ambulatory Visit: Payer: Self-pay

## 2014-07-30 DIAGNOSIS — Z1231 Encounter for screening mammogram for malignant neoplasm of breast: Secondary | ICD-10-CM

## 2014-08-05 ENCOUNTER — Ambulatory Visit
Admission: RE | Admit: 2014-08-05 | Discharge: 2014-08-05 | Disposition: A | Discharge status: Home / Self Care | Payer: BLUE CROSS/BLUE SHIELD | Source: Ambulatory Visit

## 2014-08-05 ENCOUNTER — Ambulatory Visit: Payer: Managed Care, Other (non HMO)

## 2014-08-05 DIAGNOSIS — Z1231 Encounter for screening mammogram for malignant neoplasm of breast: Secondary | ICD-10-CM

## 2015-02-28 ENCOUNTER — Other Ambulatory Visit: Payer: Self-pay | Admitting: Physician Assistant

## 2015-02-28 DIAGNOSIS — R1310 Dysphagia, unspecified: Secondary | ICD-10-CM

## 2015-03-02 ENCOUNTER — Other Ambulatory Visit: Payer: BLUE CROSS/BLUE SHIELD

## 2015-03-03 ENCOUNTER — Inpatient Hospital Stay: Admission: RE | Admit: 2015-03-03 | Payer: BLUE CROSS/BLUE SHIELD | Source: Ambulatory Visit

## 2015-03-10 ENCOUNTER — Ambulatory Visit
Admission: RE | Admit: 2015-03-10 | Discharge: 2015-03-10 | Disposition: A | Discharge status: Home / Self Care | Payer: BLUE CROSS/BLUE SHIELD | Source: Ambulatory Visit | Attending: Physician Assistant | Admitting: Physician Assistant

## 2015-03-10 DIAGNOSIS — R1310 Dysphagia, unspecified: Secondary | ICD-10-CM

## 2015-08-16 ENCOUNTER — Other Ambulatory Visit: Payer: Self-pay

## 2015-08-16 DIAGNOSIS — Z1231 Encounter for screening mammogram for malignant neoplasm of breast: Secondary | ICD-10-CM

## 2015-09-13 ENCOUNTER — Ambulatory Visit: Payer: BLUE CROSS/BLUE SHIELD

## 2015-09-27 ENCOUNTER — Ambulatory Visit
Admission: RE | Admit: 2015-09-27 | Discharge: 2015-09-27 | Disposition: A | Discharge status: Home / Self Care | Payer: BC Managed Care – PPO | Source: Ambulatory Visit

## 2015-09-27 DIAGNOSIS — Z1231 Encounter for screening mammogram for malignant neoplasm of breast: Secondary | ICD-10-CM

## 2015-10-05 ENCOUNTER — Ambulatory Visit: Payer: BLUE CROSS/BLUE SHIELD

## 2016-01-25 ENCOUNTER — Ambulatory Visit (INDEPENDENT_AMBULATORY_CARE_PROVIDER_SITE_OTHER): Payer: BC Managed Care – PPO | Admitting: Neurology

## 2016-01-25 ENCOUNTER — Encounter: Payer: Self-pay | Admitting: Neurology

## 2016-01-25 VITALS — BP 140/82 | HR 72 | Resp 20 | Ht 64.0 in | Wt 181.0 lb

## 2016-01-25 DIAGNOSIS — G2581 Restless legs syndrome: Secondary | ICD-10-CM | POA: Diagnosis not present

## 2016-01-25 DIAGNOSIS — R0683 Snoring: Secondary | ICD-10-CM | POA: Diagnosis not present

## 2016-01-25 DIAGNOSIS — G4733 Obstructive sleep apnea (adult) (pediatric): Secondary | ICD-10-CM

## 2016-01-25 DIAGNOSIS — Z9989 Dependence on other enabling machines and devices: Principal | ICD-10-CM

## 2016-01-25 DIAGNOSIS — N951 Menopausal and female climacteric states: Secondary | ICD-10-CM | POA: Diagnosis not present

## 2016-01-25 NOTE — Patient Instructions (Signed)

## 2016-01-25 NOTE — Progress Notes (Signed)
SLEEP MEDICINE CLINIC   Provider:  Melvyn Novas, M D  Referring Provider: Lenell Antu, DO Primary Care Physician:  Rockne Coons, DO  Chief Complaint  Patient presents with  . New Patient (Initial Visit)    using cpap, can't get mask to work, wakes up with headaches    HPI:  Yesennia Hirota is a 60 y.o. female , seen here as a referral from Dr. Conley Rolls for an evaluation of sleep apnea and therapeutic recommendations.   Chief complaint according to patient : " I am soo tired and I have headaches "  "Once my BP was better controlled , the HA was less - but is not gone and I wake from it "   Mrs. Selinda Orion describes her headaches as throbbing almost as if somebody punched her eye in the back, and the sudden headaches can wake her out of sleep. She does not describe an ice pick headache she does not describe an electric shock sensation. The headaches have gotten better as her blood pressure became better controlled. She wakes up with headaches frequently however. When she saw Dr. Nedra Hai she ran an average blood pressure of 135/80 mmHg. Most of the times her headaches are dull in the right temple area and can be throbbing. She has tried Flexeril in the the assumption that that could be a tension headache but it did not work. She has tried over-the-counter medications, such as Aleve, Tylenol but the headache will be back within an hour after initial relief. She has even tried some old diazepam which seems to have helped, but again just temple rarely perhaps for couple of hours or more. Mrs. Alysia Penna has been a obstructive sleep apnea patient and has been titrated to CPAP. Dr. Nedra Hai commented that she will send the patient for another sleep evaluation since she seems to have restless legs, and in addition she tolerate CPAP rather poorly.   In addition she has a slightly elevated hemoglobin A1 C, diagnosed as borderline diabetic. She also has borderline anemia.May correlate to a higher risk for restless leg  syndrome. She does have a family trait of sickle cell anemia. I have been able to review the sleep study dated 17th of December 2009, referred by Dr. Marinda Elk. She fell asleep within 2 minutes, REM sleep onset was 110 minutes, and she was only awake for 20 minutes during the whole sleep period time. Sleep efficiency was 94%. The AHI was 11.39 without any central apnea, the RDI was 13.9., But during REM sleep exacerbated 227.7. During REM sleep the oxygen nadir was 85%. The patient was snoring mildly she did not have periodic limb movements.  Sleep habits are as follows: She usually goes to bed around 9:30, the bedroom is cool and quiet, she uses a sound machine for background noise. The bedroom is dark. Her husband snores loudly and she shares a bedroom with him. She prefers to sleep on her back, which also promotes some snoring. She does not have nocturia, and usually can sleep through the night until 2.30 AM when she is up every night unrelated to headaches or any known trigger. She has trouble to maintain sleep beyond this point in time. She does not go back to sleep but has to arise finally at 7 AM, meanwhile watching the clock. She is significant behaviorally entrained insomnia. When she finally wakes up she does not feel restored, refreshed and ready to go. She is to [place] tried over the last months to reinitiate  CPAP use and I have a download available here today.   Sleep medical history and family sleep history:  She does not have a family history of obstructive sleep apnea, she does not have a childhood history of a sleep disorder no sleepwalking, night terrors no bedwetting.   Social history:  Married, employed. 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 She does not work shifts. 3 adult children.  She drinks one cup of coffee once a day, consumes neither sodas nor iced tea. Lifelong non-tobacco user, very moderate alcohol consumer. Less than 2 drinks a week.  Review of  Systems:  Out of a complete 14 system review, the patient complains of only the following symptoms, and all other reviewed systems are negative.  Snoring, insomnia,   Epworth score 7 , Fatigue severity score 44  , depression score 2/15   Social History   Social History  . Marital status: Married    Spouse name: N/A  . Number of children: N/A  . Years of education: N/A   Occupational History  . Not on file.   Social History Main Topics  . Smoking status: Never Smoker  . Smokeless tobacco: Never Used  . Alcohol use No  . Drug use: No  . Sexual activity: Yes   Other Topics Concern  . Not on file   Social History Narrative  . No narrative on file    No family history on file.  Past Medical History:  Diagnosis Date  . Diabetes mellitus without complication (HCC)   . Headache   . Hypertension   . Mixed hyperlipidemia   . OSA on CPAP     Past Surgical History:  Procedure Laterality Date  . ABDOMINAL HYSTERECTOMY    . ANKLE FRACTURE SURGERY    . FOOT TENDON SURGERY    . left shoulderr decompression      Current Outpatient Prescriptions  Medication Sig Dispense Refill  . amLODipine (NORVASC) 10 MG tablet Take 10 mg by mouth daily.    Marland Kitchen aspirin 81 MG chewable tablet Chew by mouth daily.    Marland Kitchen atenolol (TENORMIN) 100 MG tablet Take 100 mg by mouth daily.    . cloNIDine (CATAPRES) 0.1 MG tablet Take 1 tablet (0.1 mg total) by mouth 2 (two) times daily. 60 tablet 0  . cyclobenzaprine (FLEXERIL) 10 MG tablet Take 10 mg by mouth at bedtime.    Marland Kitchen estradiol (ESTRACE) 2 MG tablet Take 2 mg by mouth daily.    Marland Kitchen losartan (COZAAR) 100 MG tablet Take 100 mg by mouth daily.    . Multiple Vitamins-Minerals (MULTIVITAMIN PO) Take 1 tablet by mouth daily.    Marland Kitchen olmesartan (BENICAR) 40 MG tablet Take 40 mg by mouth daily.    . Omega-3 Fatty Acids (FISH OIL) 1000 MG CAPS Take by mouth.    Marland Kitchen scopolamine (TRANSDERM-SCOP) 1 MG/3DAYS Place 1 patch onto the skin every 3 (three) days.     Marland Kitchen triamcinolone ointment (KENALOG) 0.1 % Apply 1 application topically 2 (two) times daily.     No current facility-administered medications for this visit.     Allergies as of 01/25/2016 - Review Complete 04/28/2014  Allergen Reaction Noted  . Codeine Shortness Of Breath 01/24/2016  . Stadol [butorphanol] Anaphylaxis 04/28/2014  . Vicodin [hydrocodone-acetaminophen] Itching 04/28/2014    Vitals: BP 140/82   Pulse 72   Resp 20   Ht 5\' 4"  (1.626 m)   Wt 181 lb (82.1 kg)   BMI 31.07 kg/m  Last Weight:  Wt Readings from Last 1 Encounters:  01/25/16 181 lb (82.1 kg)   UJW:JXBJBMI:Body mass index is 31.07 kg/m.     Last Height:   Ht Readings from Last 1 Encounters:  01/25/16 5\' 4"  (1.626 m)    Physical exam:  General: The patient is awake, alert and appears not in acute distress. The patient is well groomed. Head: Normocephalic, atraumatic. Neck is supple. Mallampati 4  neck circumference:16.5. Nasal airflow patent , TMJ is not  evident . Retrognathia is seen.  Cardiovascular:  Regular rate and rhythm , without  murmurs or carotid bruit, and without distended neck veins. Respiratory: Lungs are clear to auscultation. Skin:  Without evidence of edema, or rash Trunk: BMI is 31.  The patient's posture is erect    Neurologic exam : The patient is awake and alert, oriented to place and time.   Memory subjective described as intact.  Attention span & concentration ability appears normal.  Speech is fluent,  without dysarthria, dysphonia or aphasia.  Mood and affect are appropriate.  Cranial nerves: Pupils are equal and briskly reactive to light. Funduscopic exam without evidence of pallor or edema. Extraocular movements  in vertical and horizontal planes intact and without nystagmus. Visual fields by finger perimetry are intact. Hearing to finger rub intact.   Facial sensation intact to fine touch.  Facial motor strength is symmetric and tongue and uvula move midline. Shoulder shrug  was symmetrical.   Motor exam:  Normal tone, muscle bulk and symmetric strength in all extremities.  Sensory:  Fine touch, pinprick and vibration were tested in all extremities. Proprioception tested in the upper extremities was normal.  Coordination:  Finger-to-nose maneuver  normal without evidence of ataxia, dysmetria or tremor.  Gait and station: Patient walks without assistive device and is able unassisted to climb up to the exam table. Strength within normal limits.  Stance is stable and normal.    Deep tendon reflexes: in the  upper and lower extremities are symmetric and intact. Babinski maneuver response is  downgoing.  The patient was advised of the nature of the diagnosed sleep disorder , the treatment options and risks for general a health and wellness arising from not treating the condition.  I spent more than 45 minutes of face to face time with the patient. Greater than 50% of time was spent in counseling and coordination of care. We have discussed the diagnosis and differential and I answered the patient's questions.     Assessment:  After physical and neurologic examination, review of laboratory studies,  Personal review of imaging studies, reports of other /same  Imaging studies ,  Results of polysomnography/ neurophysiology testing and pre-existing records as far as provided in visit., my assessment is   1)  Re-evaluation for apnea, which was mild and REM accentuated in 2009.   2) weight loss will help.  3)insomnia appears  habitual, may be related to menopause. Start Melatonin  At 3 - 5 mg 30 minutes before bedtime.   4) sleep related headaches, evluate for hypoxia, CO2. Retention.    Plan:  Treatment plan and additional workup :  I will order a split night polysomnography, I would like for the patient to have a nasal pillow only, CO2 retention should be measured in the first half of the split night. She can be an early arrival.   Melvyn Novasarmen Runell Kovich  MD  01/25/2016   CC: Camillo Flaminghao P Le, Do [address] [place], [place] [id number]

## 2016-01-26 LAB — IRON AND TIBC
IRON: 67 ug/dL (ref 27–159)
Iron Saturation: 21 % (ref 15–55)
TIBC: 313 ug/dL (ref 250–450)
UIBC: 246 ug/dL (ref 131–425)

## 2016-01-26 LAB — FERRITIN: Ferritin: 113 ng/mL (ref 15–150)

## 2016-01-31 ENCOUNTER — Telehealth: Payer: Self-pay

## 2016-01-31 NOTE — Telephone Encounter (Signed)
I spoke to patient and she is aware of results and recommendations.  

## 2016-01-31 NOTE — Telephone Encounter (Signed)
-----   Message from Melvyn Novasarmen Dohmeier, MD sent at 01/31/2016 12:43 PM EDT ----- Normal iron panel, no need for supplement .

## 2016-03-13 ENCOUNTER — Ambulatory Visit (INDEPENDENT_AMBULATORY_CARE_PROVIDER_SITE_OTHER): Payer: BC Managed Care – PPO | Admitting: Neurology

## 2016-03-13 DIAGNOSIS — Z9989 Dependence on other enabling machines and devices: Secondary | ICD-10-CM | POA: Diagnosis not present

## 2016-03-13 DIAGNOSIS — G4733 Obstructive sleep apnea (adult) (pediatric): Secondary | ICD-10-CM

## 2016-03-13 DIAGNOSIS — G2581 Restless legs syndrome: Secondary | ICD-10-CM

## 2016-03-23 ENCOUNTER — Telehealth: Payer: Self-pay | Admitting: Neurology

## 2016-03-23 DIAGNOSIS — G4733 Obstructive sleep apnea (adult) (pediatric): Secondary | ICD-10-CM

## 2016-03-23 NOTE — Procedures (Signed)
PATIENT'S NAME:  [name], Malay DOB:      1955/07/09      MR#:    [id number]     DATE OF RECORDING: 03/13/2016  REFERRING M.D.:  Hamilton Caprihao Le, MD Study Performed:   Baseline Polysomnogram HISTORY: Cynthia RinksMichelle Brasington is a [age] year.old. female , whose chief complaint is daytime fatigue, sleepiness : " I am soo tired and I have headaches "  "Once my BP was better controlled , the HA was less - but is not gone and I wake from it " She wakes up with headaches frequently.  Mrs. Alysia Pennarvin has been diagnosed in the past with obstructive sleep apnea and has been titrated to CPAP. Dr. Conley RollsLe commented that she tolerated CPAP rather poorly.   Diabetes mellitus, Headache, Hypertension, Mixed hyperlipidemia, OSA on CPAP. The patient endorsed the Epworth Sleepiness Scale at 7/24 points.   The patient's weight 181 pounds with a height of 64 (inches), resulting in a BMI of 30.9 kg/m2. The patient's neck circumference measured 16.5 inches.  CURRENT MEDICATIONS: Norvasc, Aspirin, Tenormin, Catapres, Flexeril, Estrace, Cozaar, Multivitamin, Benicar, Fish Oil, Transderm-Scop, Kenalog.   PROCEDURE:  This is a multichannel digital polysomnogram utilizing the Somnostar 11.2 system.  Electrodes and sensors were applied and monitored per AASM Specifications.   EEG, EOG, Chin and Limb EMG, were sampled at 200 Hz.  ECG, Snore and Nasal Pressure, Thermal Airflow, Respiratory Effort, CPAP Flow and Pressure, Oximetry was sampled at 50 Hz. Digital video and audio were recorded.      BASELINE STUDY ; Lights Out was at 21:57 and Lights On at 05:18.  Total recording time (TRT) was 442 minutes, with a total sleep time (TST) of  335 minutes.   The patient's sleep latency was 30.5 minutes.  REM latency was 77 minutes.  The sleep efficiency was 75.8 %.     SLEEP ARCHITECTURE: WASO (Wake after sleep onset) was 76.5 minutes.  There were 18 minutes in Stage N1, 176 minutes Stage N2, 60 minutes Stage N3 and 81 minutes in Stage REM.  The percentage of Stage  N1 was 5.4%, Stage N2 was 52.5%, Stage N3 was 17.9% and Stage R (REM sleep) was 24.2%.   RESPIRATORY ANALYSIS:  There were a total of 112 respiratory events:  4 obstructive apneas, 0 central apneas and 0 mixed apneas with a total of 4 apneas and an apnea index (AI) of .7 /hour. There were 108 hypopneas with a hypopnea index of 19.3 /hour. The patient also had 0 respiratory event related arousals (RERAs).     The total APNEA/HYPOPNEA INDEX (AHI) was 20.1/hour and the total RESPIRATORY DISTURBANCE INDEX was 20.1 /hour.  67 events occurred in REM sleep and 90 events in NREM. The REM AHI was 49.6 /hour, versus a non-REM AHI of 10.6. The patient spent 271 minutes of total sleep time in the supine position and 64 minutes in non-supine.. The supine AHI was 16.8 versus a non-supine AHI of 33.8.  Snoring was noted. EKG was in keeping with normal sinus rhythm (NSR).   OXYGEN SATURATION & C02:  The Wake baseline 02 saturation was 96%, with the lowest being 83%. Time spent below 89% saturation equaled 5.0 minutes.  PERIODIC LIMB MOVEMENTS:   The patient had a total of 20 Periodic Limb Movements.  The Periodic Limb Movement (PLM) index was 3.6 and the PLM Arousal index was 0.2/hour.   IMPRESSION:The patient has moderately severe OSA with strong REM accentuation. No clinically significant degree of hypoxemia was noted. No evidence  of PLM disorder.  Snoring recorded in supine and non supine positions. Regular EKG within sinus rhythm.   RECOMMENDATIONS:  Mrs Bencivenga's degree of apnea and the strong REM accentuation make CPAP treatment the therapy of choice.  I recommend an interface fitting and desensitization and  CPAP retitration.  CPAP will address snoring as well.    1. Advise to lose weight, by diet and exercise,  if not contraindicated (BMI 31). 2. Further information regarding OSA may be obtained from [employer] (www.sleepfoundation.org) or American Sleep Apnea Association  (www.sleepapnea.org). 3. Given the history of claustrophobia, CPAP desensitization protocol should be considered.  This may be arranged directly with [facility]   4. A follow up appointment will be scheduled [facility]. The referring provider will be notified of the results.      I certify that I have reviewed the entire raw data recording prior to the issuance of this report in accordance with the Standards of Accreditation of the American Academy of Sleep Medicine (AASM)     Melvyn Novasarmen Adalina Dopson, MD  03-23-2016 Diplomat, American Board of Psychiatry and Neurology  Diplomat, American Board of Sleep Medicine Medical Director, [facility]

## 2016-03-23 NOTE — Telephone Encounter (Signed)
Pt called for sleep results. Pt was advised there is a 10-14 day turn around time. She understood

## 2016-03-23 NOTE — Telephone Encounter (Signed)
Please call patient with PSG results. OSA with strong REM dependence -this form is not responsive to dental device   She needs still to use CPAP. Prefer  return for CPAP titration and desensitization at [facility].  CD

## 2016-03-26 ENCOUNTER — Telehealth: Payer: Self-pay

## 2016-03-26 NOTE — Telephone Encounter (Signed)
LM for patient to schedule desensitation

## 2016-03-26 NOTE — Telephone Encounter (Signed)
I called pt to discuss sleep study results. No answer, left a message asking her to call me back. 

## 2016-03-27 NOTE — Telephone Encounter (Signed)
I spoke to pt. I advised her that Dr. Vickey Hugerohmeier reviewed her sleep study results and found that she has moderately severe osa with strong REM accentuation. D. Dohmeier recommends cpap as the treatment of choice because a dental device will not work well for this type of osa, and recommends a cpap titration study. Pt is agreeable to coming in for a cpap titration study and cpap desensitation, based on its affordability. I advised pt that our sleep lab will her to schedule the study and with financial information. Pt says that her current cpap is barely used and about 58 years old but was able to be downloaded in the office. Pt verbalized understanding of results. Pt had no questions at this time but was encouraged to call back if questions arise.

## 2016-04-17 NOTE — Telephone Encounter (Signed)
Pt needs clarification why another study is to be done

## 2016-04-18 NOTE — Telephone Encounter (Signed)
LM (per DPR) with the purpose for a titration study. Left call back number for further questions.

## 2016-05-03 ENCOUNTER — Ambulatory Visit (INDEPENDENT_AMBULATORY_CARE_PROVIDER_SITE_OTHER): Payer: BC Managed Care – PPO | Admitting: Neurology

## 2016-05-03 DIAGNOSIS — G4733 Obstructive sleep apnea (adult) (pediatric): Secondary | ICD-10-CM

## 2016-05-04 ENCOUNTER — Telehealth: Payer: Self-pay | Admitting: Neurology

## 2016-05-04 DIAGNOSIS — G4733 Obstructive sleep apnea (adult) (pediatric): Secondary | ICD-10-CM

## 2016-05-04 DIAGNOSIS — G4730 Sleep apnea, unspecified: Principal | ICD-10-CM

## 2016-05-04 DIAGNOSIS — G4700 Insomnia, unspecified: Secondary | ICD-10-CM

## 2016-05-04 NOTE — Telephone Encounter (Signed)
Orders placed.  Auto CPAP with 3 cm air sense starting at 7 cm water pressure and 15 cm upper pressure window,  The Patient was fitted with a Fisher-Paykel Simplus F.F. small size mask. Heated humidity is ordered.

## 2016-05-04 NOTE — Procedures (Signed)
PATIENT'S NAME:  [name], Charolette DOB:      01-12-1956      MR#:    [id number]     DATE OF RECORDING: 05/03/2016 REFERRING M.D.:  Hamilton Caprihao Le, MD Study Performed:   CPAP  Titration HISTORY:  [facility] revealed AHI of 20./1 and REM AHI of 49.6 . Nadir SPo2 was 83%  Diabetes mellitus, Headaches, Hypertension, Mixed hyperlipidemia, known OSA on CPAP. The patient endorsed the Epworth Sleepiness Scale at 7/24 points and her  weight was 181 pounds with a height of 64 (inches), resulting in a BMI of 30.9 kg/m2.The patient's neck circumference measured 16.5 inches.  CURRENT MEDICATIONS: Norvasc, Aspirin, Tenormin, Catapres, Flexeril, Estrace, Cozaar, Multivitamin, Benicar, Fish Oil, Transderm-Scop, Kenalog.   PROCEDURE:  This is a multichannel digital polysomnogram utilizing the SomnoStar 11.2 system.  Electrodes and sensors were applied and monitored per AASM Specifications.   EEG, EOG, Chin and Limb EMG, were sampled at 200 Hz.  ECG, Snore and Nasal Pressure, Thermal Airflow, Respiratory Effort, CPAP Flow and Pressure, Oximetry was sampled at 50 Hz. Digital video and audio were recorded.       CPAP was initiated at 5 cmH20 with heated humidity per AASM split night standards and pressure was advanced to 13 cmH20 because of hypopneas, apneas and desaturations.  At a PAP pressure of 12 cmH20, there was a reduction of the AHI to 0.0 with improvement of the above symptoms of obstructive sleep apnea.    Lights Out was at 22:33 and Lights On at 05:04. Total recording time (TRT) was 389 minutes, with a total sleep time (TST) of 164.5 minutes. The patient's sleep latency was 55.5 minutes with 8 minutes of wake time after sleep onset. REM latency was 274.5 minutes.  The sleep efficiency was 42.3 %.    SLEEP ARCHITECTURE: WASO (Wake after sleep onset) was 161 minutes.  There were 2 minutes in Stage N1, 117.5 minutes Stage N2, 0 minutes Stage N3 and 45 minutes in Stage REM.  The  percentage of Stage N1 was 1.2%, Stage N2 was 71.4%, Stage N3 was 0% and Stage R (REM sleep) was 27.4%. The sleep architecture was notable for delayed, but sustained REM sleep at 9 cm water pressure with an AHI of 7.4 .  RESPIRATORY ANALYSIS:  There was a total of 11 respiratory events: 1 obstructive apneas, 1 central apneas and 0 mixed apneas with a total of 2 apneas and an apnea index (AI) of .7 /hour. There were 9 hypopneas with a hypopnea index of 3.3 /hour. The patient also had 4 respiratory event related arousals (RERAs).      The total APNEA/HYPOPNEA INDEX (AHI) was 4. /hour and the total RESPIRATORY DISTURBANCE INDEX was 5.5 .hour  6 events occurred in REM sleep and 5 events in NREM. The REM AHI was 8 /hour versus a non-REM AHI of 2.5 /hour.  The patient spent 95 minutes of total sleep time in the supine position and 70 minutes in non-supine. The supine AHI was 2.5, versus a non-supine AHI of 6.1.  OXYGEN SATURATION & C02:  The baseline 02 saturation was 98%, with the lowest being 90%. Time spent below 89% saturation equaled 0 minutes.  PERIODIC LIMB MOVEMENTS:    The patient had a total of 0 Periodic Limb Movements.  The arousals were noted as: 20 were spontaneous, 0 were associated with PLMs, 1 was associated with respiratory events.  Audio and video analysis did not show any abnormal  or unusual movements, behaviors, phonations or vocalizations.   The patient took one bathroom break. Snoring was finally alleviated at 13 cm water CPAP pressure. EKG was in keeping with normal sinus rhythm (NSR).  The Patient was fitted at first with a nasal pillow, but this became uncomfortable, finally with a Fisher-Paykel Simplus F.F. small size mask. CPAP with heated humidity was used at a final pressure of 13 cm water.   DIAGNOSIS 1. Obstructive Sleep Apnea and fragmented sleep, light sleep. CPAP did alleviate the OSA but the patient woke up after each small increase in pressure. Snoring was finally  alleviated at 13 cm water.     PLANS/RECOMMENDATIONS:  Auto CPAP with 3 cm air sense starting at 7 cm water pressure and 15 cm upper pressure window,  The Patient was fitted with a Fisher-Paykel Simplus F.F. small size mask. Heated humidity is ordered.   1. Sleep hygiene:  Sleep hygiene measures should be carefully reviewed with the patient by the primary provider.  Check the bedroom environment for light, noise, temperature, or other factors which could disrupt sleep; use bedroom for sleeping and sex only; follow a regular schedule for sleeping and waking (goal is seven to eight hours sleep per night, but patient should not be in bed any time he or she does not feel like sleeping); limit naps to no more than one hour and no later than mid-afternoon; medically-supervised exercise plan, with exercise in late afternoon and no later than three hours prior to bedtime; avoid eating just before bed; avoid caffeine for six hours before bedtime; avoid alcohol; avoid tobacco; engage only in relaxing activities prior to bed, such as quiet music, reading under soft light, relaxation exercises such as deep breathing or progressive relaxation.  Avoid looking at alarm clock in bed; turn alarm clock facing away from patient.  If the patient worries in bed, one helpful intervention is to have the patient write down a list of concerns nightly before bed; and then to put them out of mind by discarding the list, after the patient confirms that there is nothing more he/she can do about them today. 2. Driving precautions:  The patient should be instructed that the [place] prohibits persons from operating a motor vehicle if sleepy, and should also be instructed to avoid driving and to avoid hazardous machinery if sleepy.  3. Snoring:  a. Weight loss, if appropriate. b. Avoidance of medications with muscle relaxant properties. c. Avoidance of ingestion of alcohol prior to sleeping( if applicable ) d. Avoiding  sleeping in the supine position (on one's back). e. Improvement of nasal patency if indicated.  DISCUSSION: Auto CPAP with 3 cm air sense starting at 7 cm water pressure and 15 cm upper pressure window,  The Patient was fitted with a Fisher-Paykel Simplus F.F. small size mask. Heated humidity is ordered.   A follow up [facility].   Please call [phone] with any questions.      I certify that I have reviewed the entire raw data recording prior to the issuance of this report in accordance with the Standards of Accreditation of the American Academy of Sleep Medicine (AASM)     Melvyn Novasarmen Balen Woolum, M.D.  05-04-2016  Diplomat, American Board of Psychiatry and Neurology  Diplomat, American Board of Sleep Medicine Medical Director, [facility]

## 2016-05-14 NOTE — Telephone Encounter (Signed)
-----   Message from Melvyn Novasarmen Dohmeier, MD sent at 05/04/2016 11:16 AM EST ----- DISCUSSION: OSA alleviated  Under CPAP, but patient had problems to adjust.    Auto CPAP with 3 cm air sense starting at 7 cm water pressure and 15 cm upper pressure window,  The Patient was fitted with a Fisher-Paykel Simplus F.F. small size mask. Heated humidity is ordered.

## 2016-05-14 NOTE — Telephone Encounter (Signed)
I called pt. I advised her that her sleep study revealed that her osa was alleviated under cpap but that she had problems adjusting to cpap. Pt is agreeable to starting a new cpap at home. Will send to [facility]. I reviewed cpap compliance with pt and pt knows that [facility] will call her within a week to discuss set up. A follow up appt was made for 07/17/16 at 9:30am. Pt verbalized understanding of results. Pt had no questions at this time but was encouraged to call back if questions arise.

## 2016-07-17 ENCOUNTER — Ambulatory Visit: Payer: Self-pay | Admitting: Neurology

## 2016-08-20 ENCOUNTER — Other Ambulatory Visit: Payer: Self-pay | Admitting: Obstetrics & Gynecology

## 2016-08-20 DIAGNOSIS — Z1231 Encounter for screening mammogram for malignant neoplasm of breast: Secondary | ICD-10-CM

## 2016-09-26 NOTE — Progress Notes (Signed)
Received this notice from [facility]: "She has declined new Cpap set up at this time for financial reasons and because she advises her old machine is working great.  She advised that she will bring her SD card in for download at her appointment with Dr. Vickey Hugerohmeier."

## 2016-09-27 ENCOUNTER — Ambulatory Visit: Payer: BC Managed Care – PPO

## 2016-09-27 ENCOUNTER — Ambulatory Visit: Payer: Self-pay | Admitting: Neurology

## 2016-10-18 ENCOUNTER — Ambulatory Visit
Admission: RE | Admit: 2016-10-18 | Discharge: 2016-10-18 | Disposition: A | Discharge status: Home / Self Care | Payer: BC Managed Care – PPO | Source: Ambulatory Visit | Attending: Obstetrics & Gynecology | Admitting: Obstetrics & Gynecology

## 2016-10-18 DIAGNOSIS — Z1231 Encounter for screening mammogram for malignant neoplasm of breast: Secondary | ICD-10-CM

## 2017-10-03 IMAGING — RF DG ESOPHAGUS
19 of 24 series · 19 of 24 positions shown · non-contrast
Comparison: None.

CLINICAL DATA: Dysphagia

EXAM:
ESOPHOGRAM / BARIUM SWALLOW / BARIUM TABLET STUDY
TECHNIQUE: Combined double contrast and single contrast examination performed
using effervescent crystals, thick barium liquid, and thin barium
liquid. The patient was observed with fluoroscopy swallowing a 13 mm
barium sulphate tablet.
FLUOROSCOPY TIME:  Radiation Exposure Index (as provided by the
fluoroscopic device): 47 deciGy per square cm
If the device does not provide the exposure index:
Fluoroscopy Time:  1 minutes 48 seconds
Number of Acquired Images:

[Series 2: run · 1 of 1 slices shown (1 of 19)]
[im 1/1]
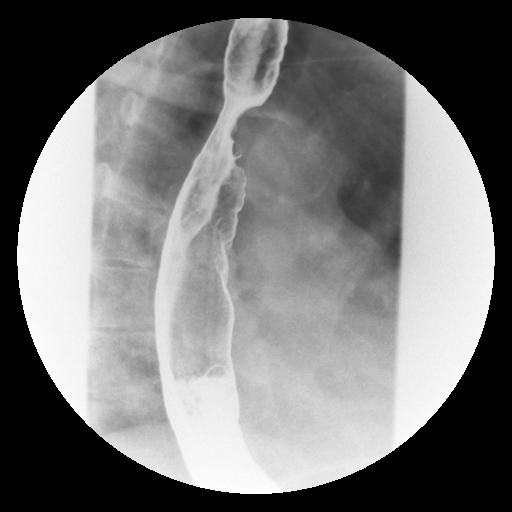

[Series 3: run · 1 of 1 slices shown (2 of 19)]
[im 1/1]
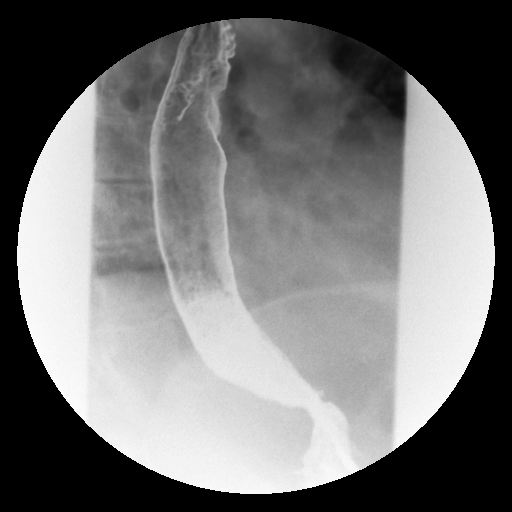

[Series 5: run · 1 of 1 slices shown (3 of 19)]
[im 1/1]
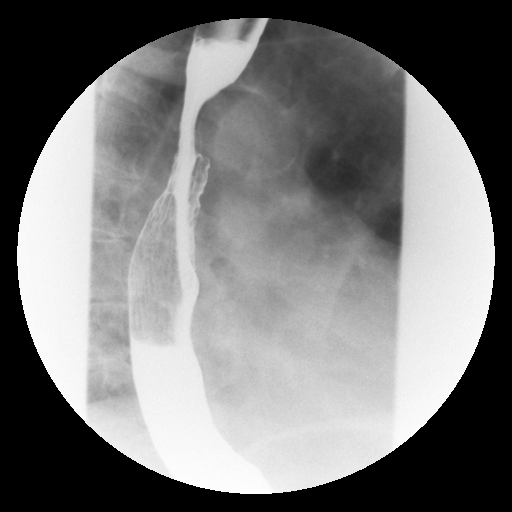

[Series 6: run · 1 of 1 slices shown (4 of 19)]
[im 1/1]
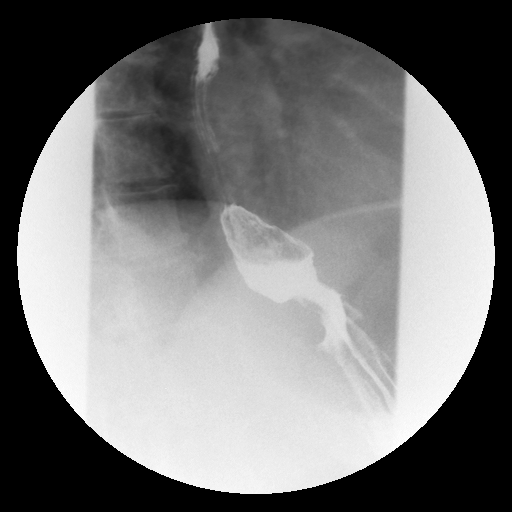

[Series 7: run · 1 of 9 slices shown (5 of 19)]
[im 1/9]
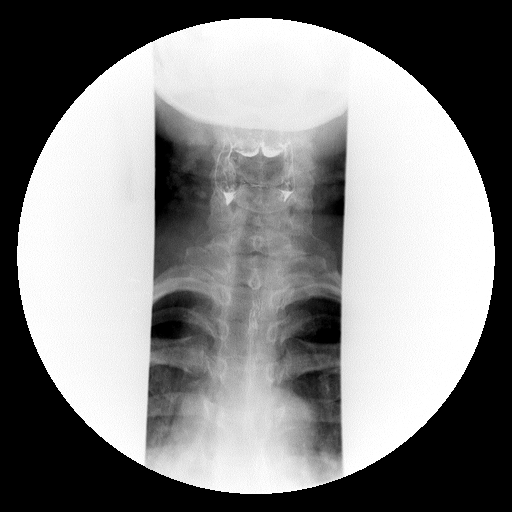

[Series 8: run · 1 of 9 slices shown (6 of 19)]
[im 1/9]
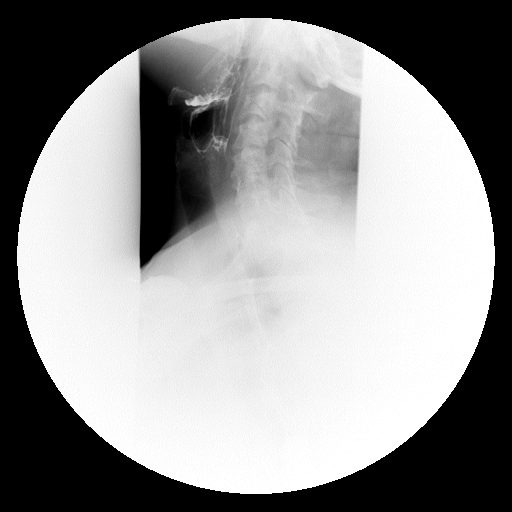

[Series 10: run · 1 of 1 slices shown (7 of 19)]
[im 1/1]
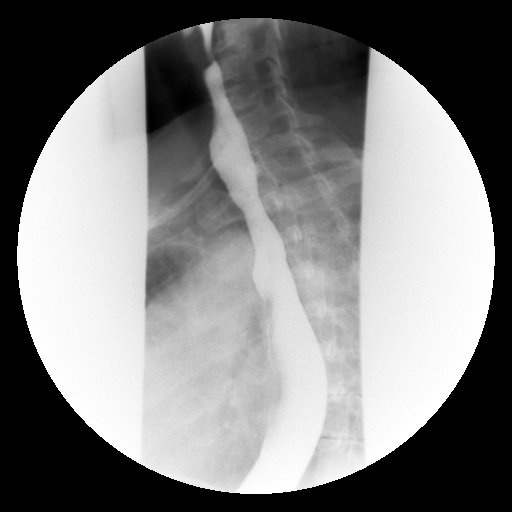

[Series 11: run · 1 of 1 slices shown (8 of 19)]
[im 1/1]
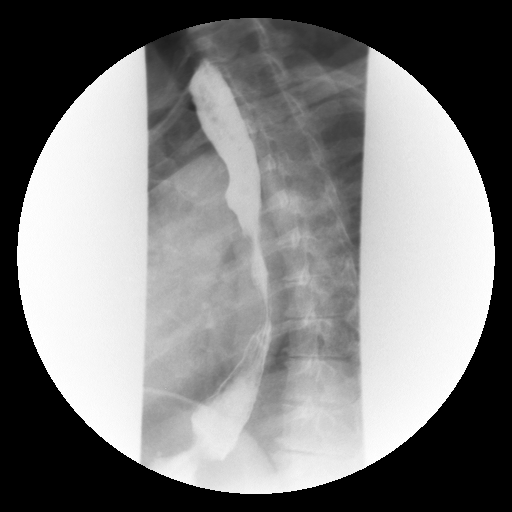

[Series 12: run · 1 of 1 slices shown (9 of 19)]
[im 1/1]
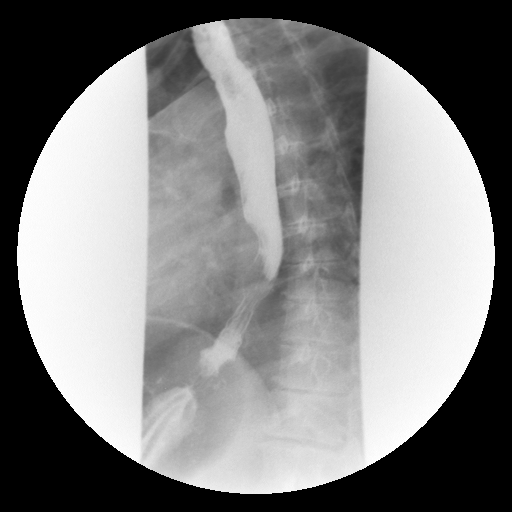

[Series 14: run · 1 of 1 slices shown (10 of 19)]
[im 1/1]
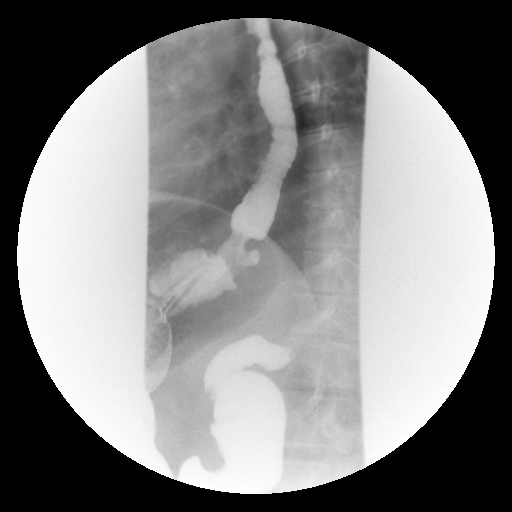

[Series 15: run · 1 of 1 slices shown (11 of 19)]
[im 1/1]
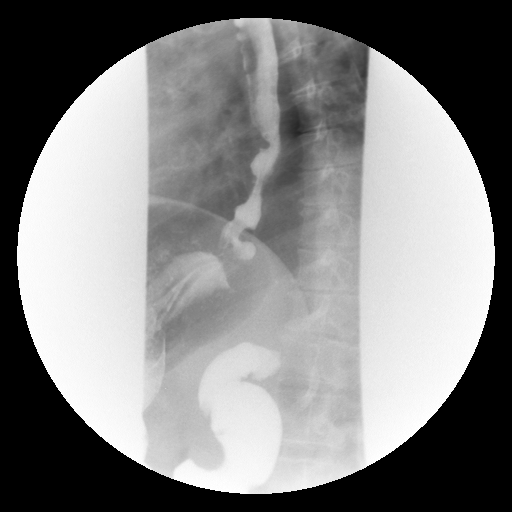

[Series 16: run · 1 of 1 slices shown (12 of 19)]
[im 1/1]
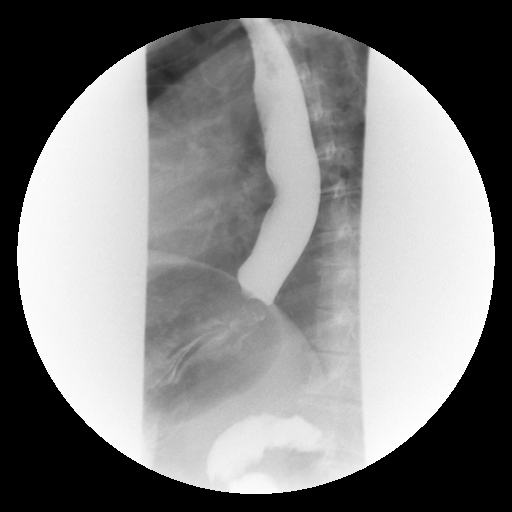

[Series 17: run · 1 of 1 slices shown (13 of 19)]
[im 1/1]
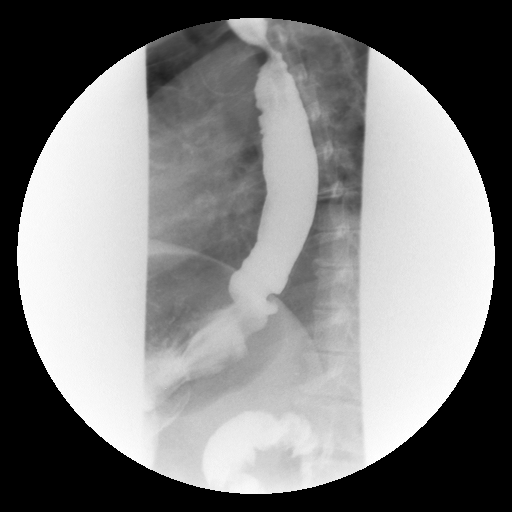

[Series 19: run · 1 of 1 slices shown (14 of 19)]
[im 1/1]
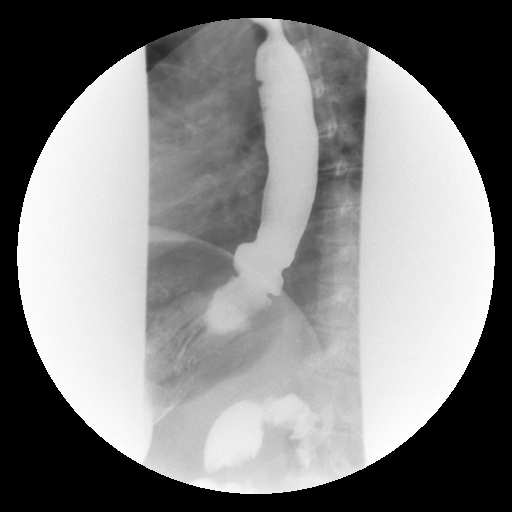

[Series 20: run · 1 of 1 slices shown (15 of 19)]
[im 1/1]
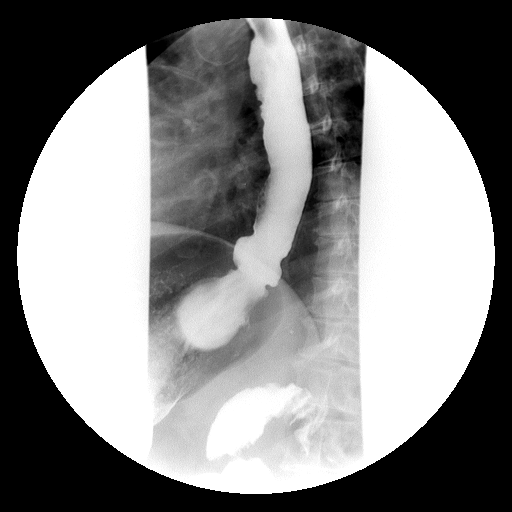

[Series 21: run · 1 of 1 slices shown (16 of 19)]
[im 1/1]
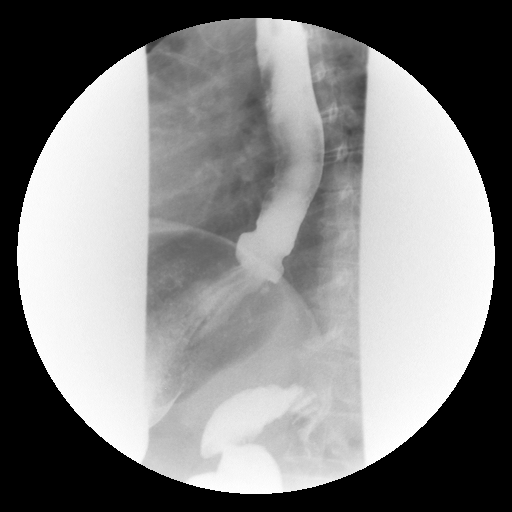

[Series 22: run · 1 of 1 slices shown (17 of 19)]
[im 1/1]
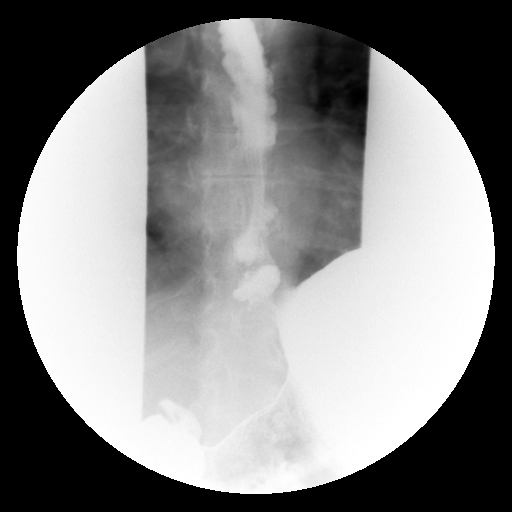

[Series 25: run · 1 of 1 slices shown (18 of 19)]
[im 1/1]
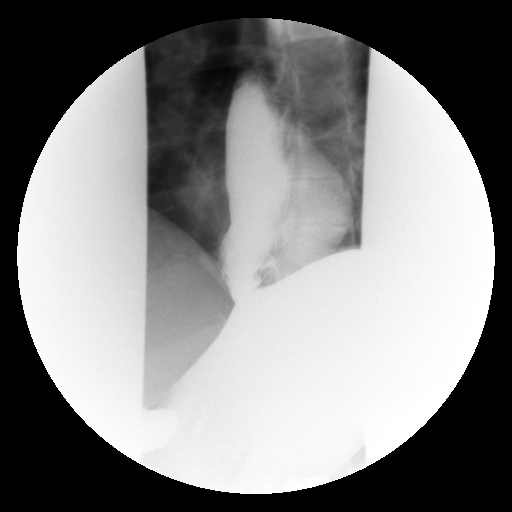

[Series 26: run · 1 of 1 slices shown (19 of 19)]
[im 1/1]
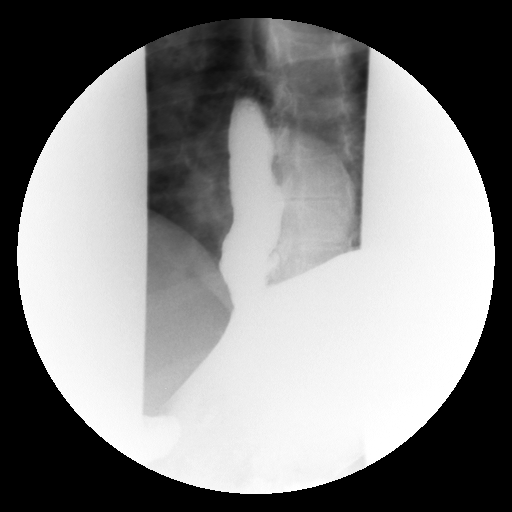

[19 of 24 positions shown; findings below may reference images not displayed]

FINDINGS: A double-contrast barium swallow was performed. The mucosa of the
esophagus is unremarkable. A single contrast study shows the
swallowing mechanism to be normal. Only mild tertiary contractions
are noted in the mid and distal esophagus. A small hiatal hernia is
present. With the water siphon maneuver there is moderate
gastroesophageal reflux demonstrated. A barium pill was given at the
end of the study which passed into the stomach without delay.
IMPRESSION: 1. Very small hiatal hernia with moderate gastroesophageal reflux.
Barium pill passes into the stomach without delay.
2. Mild to moderate tertiary contractions in the mid and distal
esophagus.
